# Patient Record
Sex: Female | Born: 1965
Health system: Southern US, Community
[De-identification: ages and names within clinical notes are randomized; demographics above are authoritative.]

## PROBLEM LIST (undated history)

## (undated) DIAGNOSIS — G629 Polyneuropathy, unspecified: Secondary | ICD-10-CM

## (undated) DIAGNOSIS — E119 Type 2 diabetes mellitus without complications: Secondary | ICD-10-CM

## (undated) DIAGNOSIS — I10 Essential (primary) hypertension: Secondary | ICD-10-CM

## (undated) HISTORY — DX: Essential (primary) hypertension: I10

## (undated) HISTORY — DX: Type 2 diabetes mellitus without complications: E11.9

## (undated) HISTORY — DX: Polyneuropathy, unspecified: G62.9

---

## 1998-01-04 ENCOUNTER — Emergency Department (HOSPITAL_COMMUNITY): Admission: EM | Admit: 1998-01-04 | Discharge: 1998-01-04 | Payer: Self-pay | Admitting: Emergency Medicine

## 1998-01-04 ENCOUNTER — Encounter: Payer: Self-pay | Admitting: Internal Medicine

## 1998-01-05 ENCOUNTER — Emergency Department (HOSPITAL_COMMUNITY): Admission: EM | Admit: 1998-01-05 | Discharge: 1998-01-05 | Payer: Self-pay | Admitting: Emergency Medicine

## 1998-01-13 ENCOUNTER — Other Ambulatory Visit: Admission: RE | Admit: 1998-01-13 | Discharge: 1998-01-13 | Payer: Self-pay | Admitting: Obstetrics and Gynecology

## 1999-01-13 ENCOUNTER — Encounter: Payer: Self-pay | Admitting: Obstetrics and Gynecology

## 1999-01-13 ENCOUNTER — Inpatient Hospital Stay (HOSPITAL_COMMUNITY): Admission: AD | Admit: 1999-01-13 | Discharge: 1999-01-13 | Payer: Self-pay | Admitting: Obstetrics and Gynecology

## 1999-01-16 ENCOUNTER — Inpatient Hospital Stay (HOSPITAL_COMMUNITY): Admission: AD | Admit: 1999-01-16 | Discharge: 1999-01-16 | Payer: Self-pay | Admitting: Obstetrics and Gynecology

## 1999-01-21 ENCOUNTER — Inpatient Hospital Stay (HOSPITAL_COMMUNITY): Admission: AD | Admit: 1999-01-21 | Discharge: 1999-01-21 | Payer: Self-pay | Admitting: Obstetrics and Gynecology

## 1999-01-27 ENCOUNTER — Encounter: Admission: RE | Admit: 1999-01-27 | Discharge: 1999-02-27 | Payer: Self-pay | Admitting: Obstetrics and Gynecology

## 1999-01-29 ENCOUNTER — Inpatient Hospital Stay (HOSPITAL_COMMUNITY): Admission: AD | Admit: 1999-01-29 | Discharge: 1999-01-29 | Payer: Self-pay | Admitting: Obstetrics and Gynecology

## 1999-01-29 ENCOUNTER — Encounter: Payer: Self-pay | Admitting: Obstetrics and Gynecology

## 1999-01-31 ENCOUNTER — Inpatient Hospital Stay (HOSPITAL_COMMUNITY): Admission: AD | Admit: 1999-01-31 | Discharge: 1999-01-31 | Payer: Self-pay | Admitting: *Deleted

## 1999-02-01 ENCOUNTER — Inpatient Hospital Stay (HOSPITAL_COMMUNITY): Admission: AD | Admit: 1999-02-01 | Discharge: 1999-02-01 | Payer: Self-pay | Admitting: Obstetrics and Gynecology

## 1999-02-04 ENCOUNTER — Inpatient Hospital Stay (HOSPITAL_COMMUNITY): Admission: AD | Admit: 1999-02-04 | Discharge: 1999-02-04 | Payer: Self-pay | Admitting: Obstetrics and Gynecology

## 1999-02-06 ENCOUNTER — Inpatient Hospital Stay (HOSPITAL_COMMUNITY): Admission: AD | Admit: 1999-02-06 | Discharge: 1999-02-06 | Payer: Self-pay | Admitting: Obstetrics and Gynecology

## 1999-02-09 ENCOUNTER — Inpatient Hospital Stay (HOSPITAL_COMMUNITY): Admission: RE | Admit: 1999-02-09 | Discharge: 1999-02-09 | Payer: Self-pay | Admitting: Obstetrics and Gynecology

## 1999-02-09 ENCOUNTER — Encounter: Payer: Self-pay | Admitting: Obstetrics and Gynecology

## 1999-02-14 ENCOUNTER — Inpatient Hospital Stay (HOSPITAL_COMMUNITY): Admission: AD | Admit: 1999-02-14 | Discharge: 1999-02-14 | Payer: Self-pay | Admitting: Obstetrics and Gynecology

## 1999-02-17 ENCOUNTER — Inpatient Hospital Stay (HOSPITAL_COMMUNITY): Admission: RE | Admit: 1999-02-17 | Discharge: 1999-02-17 | Payer: Self-pay | Admitting: Obstetrics and Gynecology

## 1999-02-17 ENCOUNTER — Encounter: Payer: Self-pay | Admitting: Obstetrics and Gynecology

## 1999-02-19 ENCOUNTER — Inpatient Hospital Stay (HOSPITAL_COMMUNITY): Admission: AD | Admit: 1999-02-19 | Discharge: 1999-02-23 | Payer: Self-pay | Admitting: Obstetrics and Gynecology

## 1999-02-24 ENCOUNTER — Inpatient Hospital Stay (HOSPITAL_COMMUNITY): Admission: AD | Admit: 1999-02-24 | Discharge: 1999-02-24 | Payer: Self-pay | Admitting: Obstetrics & Gynecology

## 1999-03-29 ENCOUNTER — Other Ambulatory Visit: Admission: RE | Admit: 1999-03-29 | Discharge: 1999-03-29 | Payer: Self-pay | Admitting: Obstetrics and Gynecology

## 1999-04-03 ENCOUNTER — Encounter: Payer: Self-pay | Admitting: Obstetrics and Gynecology

## 1999-04-03 ENCOUNTER — Ambulatory Visit (HOSPITAL_COMMUNITY): Admission: RE | Admit: 1999-04-03 | Discharge: 1999-04-03 | Payer: Self-pay | Admitting: Obstetrics and Gynecology

## 1999-12-07 ENCOUNTER — Emergency Department (HOSPITAL_COMMUNITY): Admission: EM | Admit: 1999-12-07 | Discharge: 1999-12-07 | Payer: Self-pay | Admitting: Emergency Medicine

## 2000-05-02 ENCOUNTER — Other Ambulatory Visit: Admission: RE | Admit: 2000-05-02 | Discharge: 2000-05-02 | Payer: Self-pay | Admitting: Obstetrics and Gynecology

## 2000-05-02 ENCOUNTER — Encounter (INDEPENDENT_AMBULATORY_CARE_PROVIDER_SITE_OTHER): Payer: Self-pay | Admitting: Specialist

## 2001-02-18 ENCOUNTER — Encounter: Admission: RE | Admit: 2001-02-18 | Discharge: 2001-02-18 | Payer: Self-pay | Admitting: *Deleted

## 2001-02-18 ENCOUNTER — Encounter: Payer: Self-pay | Admitting: *Deleted

## 2001-09-29 ENCOUNTER — Other Ambulatory Visit: Admission: RE | Admit: 2001-09-29 | Discharge: 2001-09-29 | Payer: Self-pay | Admitting: Obstetrics and Gynecology

## 2002-12-23 ENCOUNTER — Encounter: Admission: RE | Admit: 2002-12-23 | Discharge: 2002-12-23 | Payer: Self-pay | Admitting: Internal Medicine

## 2002-12-24 ENCOUNTER — Encounter: Admission: RE | Admit: 2002-12-24 | Discharge: 2002-12-24 | Payer: Self-pay | Admitting: Geriatric Medicine

## 2003-11-05 ENCOUNTER — Other Ambulatory Visit: Admission: RE | Admit: 2003-11-05 | Discharge: 2003-11-05 | Payer: Self-pay | Admitting: Obstetrics and Gynecology

## 2004-10-30 ENCOUNTER — Encounter: Admission: RE | Admit: 2004-10-30 | Discharge: 2004-10-30 | Payer: Self-pay | Admitting: Internal Medicine

## 2005-02-16 ENCOUNTER — Encounter: Admission: RE | Admit: 2005-02-16 | Discharge: 2005-02-16 | Payer: Self-pay | Admitting: Geriatric Medicine

## 2005-04-12 ENCOUNTER — Other Ambulatory Visit: Admission: RE | Admit: 2005-04-12 | Discharge: 2005-04-12 | Payer: Self-pay | Admitting: Obstetrics and Gynecology

## 2005-06-15 ENCOUNTER — Encounter: Admission: RE | Admit: 2005-06-15 | Discharge: 2005-06-15 | Payer: Self-pay | Admitting: Geriatric Medicine

## 2006-01-18 ENCOUNTER — Encounter: Admission: RE | Admit: 2006-01-18 | Discharge: 2006-01-18 | Payer: Self-pay | Admitting: Obstetrics and Gynecology

## 2007-02-07 ENCOUNTER — Encounter: Admission: RE | Admit: 2007-02-07 | Discharge: 2007-02-07 | Payer: Self-pay | Admitting: Geriatric Medicine

## 2007-06-13 ENCOUNTER — Encounter: Admission: RE | Admit: 2007-06-13 | Discharge: 2007-06-13 | Payer: Self-pay | Admitting: Obstetrics and Gynecology

## 2007-06-18 ENCOUNTER — Encounter: Admission: RE | Admit: 2007-06-18 | Discharge: 2007-06-18 | Payer: Self-pay | Admitting: Obstetrics and Gynecology

## 2007-11-28 ENCOUNTER — Other Ambulatory Visit: Admission: RE | Admit: 2007-11-28 | Discharge: 2007-11-28 | Payer: Self-pay | Admitting: Obstetrics and Gynecology

## 2008-07-09 ENCOUNTER — Encounter: Admission: RE | Admit: 2008-07-09 | Discharge: 2008-07-09 | Payer: Self-pay | Admitting: Obstetrics and Gynecology

## 2008-08-02 ENCOUNTER — Encounter: Admission: RE | Admit: 2008-08-02 | Discharge: 2008-08-02 | Payer: Self-pay | Admitting: Geriatric Medicine

## 2009-04-19 ENCOUNTER — Other Ambulatory Visit: Admission: RE | Admit: 2009-04-19 | Discharge: 2009-04-19 | Payer: Self-pay | Admitting: Obstetrics and Gynecology

## 2009-09-14 ENCOUNTER — Encounter: Admission: RE | Admit: 2009-09-14 | Discharge: 2009-09-14 | Payer: Self-pay | Admitting: Geriatric Medicine

## 2010-02-26 ENCOUNTER — Encounter: Payer: Self-pay | Admitting: Geriatric Medicine

## 2010-02-26 ENCOUNTER — Encounter: Payer: Self-pay | Admitting: Obstetrics and Gynecology

## 2010-10-11 ENCOUNTER — Other Ambulatory Visit: Payer: Self-pay | Admitting: Geriatric Medicine

## 2010-10-11 DIAGNOSIS — Z1231 Encounter for screening mammogram for malignant neoplasm of breast: Secondary | ICD-10-CM

## 2010-10-20 ENCOUNTER — Ambulatory Visit: Payer: Self-pay

## 2010-10-23 ENCOUNTER — Ambulatory Visit: Payer: Self-pay

## 2010-10-30 ENCOUNTER — Ambulatory Visit
Admission: RE | Admit: 2010-10-30 | Discharge: 2010-10-30 | Disposition: A | Discharge status: Home / Self Care | Payer: PRIVATE HEALTH INSURANCE | Source: Ambulatory Visit | Attending: Geriatric Medicine | Admitting: Geriatric Medicine

## 2010-10-30 DIAGNOSIS — Z1231 Encounter for screening mammogram for malignant neoplasm of breast: Secondary | ICD-10-CM

## 2010-11-23 IMAGING — MG MM SCREEN MAMMOGRAM BILATERAL
4 series · 4 of 4 positions shown · non-contrast
Comparison: none

DG SCREEN MAMMOGRAM BILATERAL
Bilateral CC and MLO view(s) were taken.

DIGITAL SCREENING MAMMOGRAM WITH CAD:
There are scattered fibroglandular densities.  No masses or malignant type calcifications are 
identified.  Compared with prior studies.

[R CC]
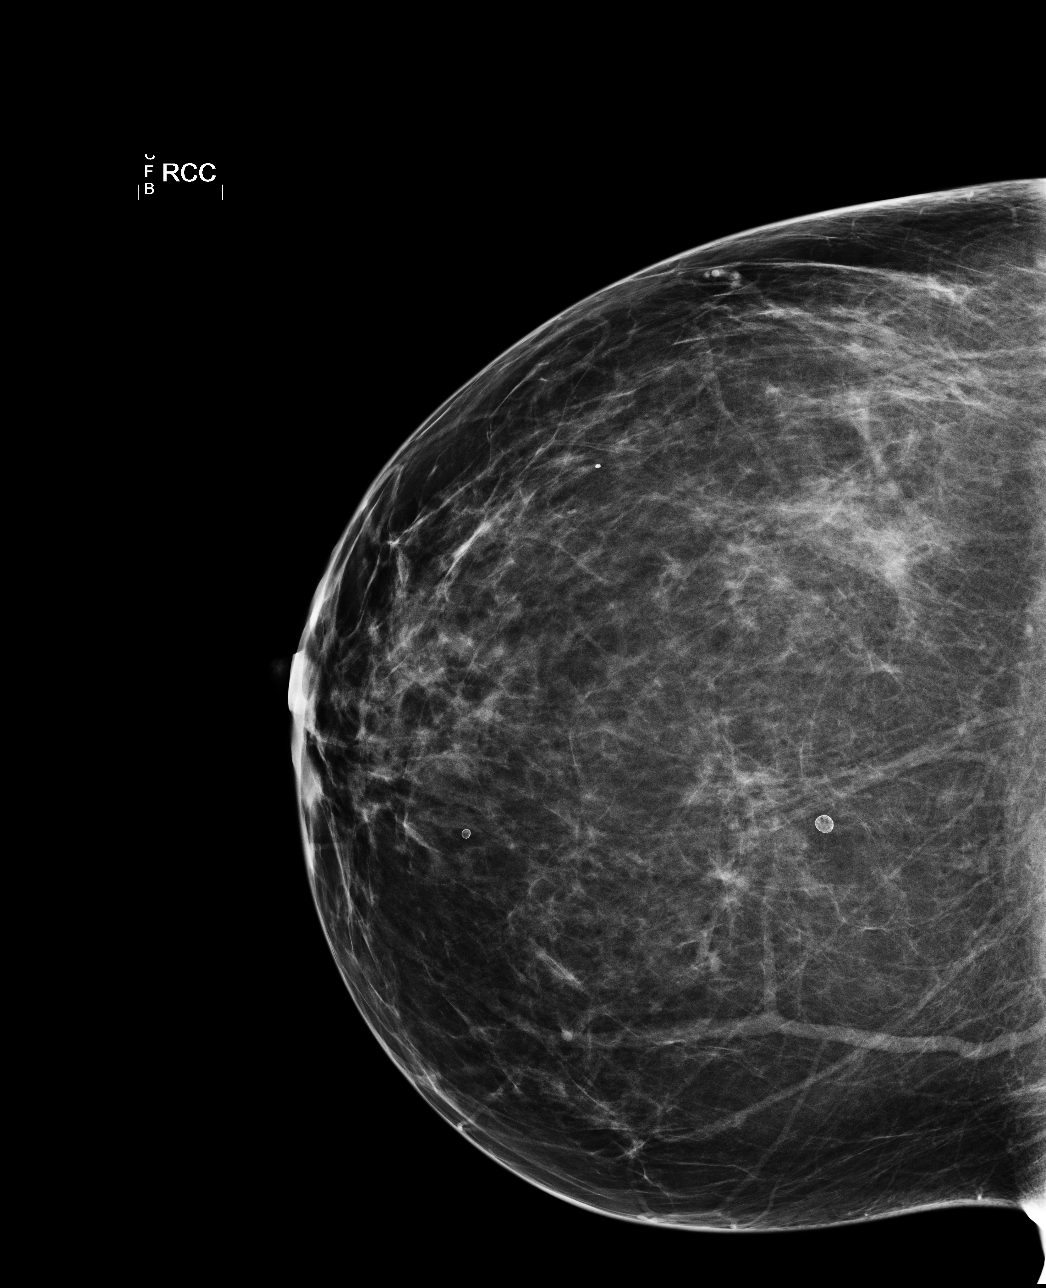

[L CC]
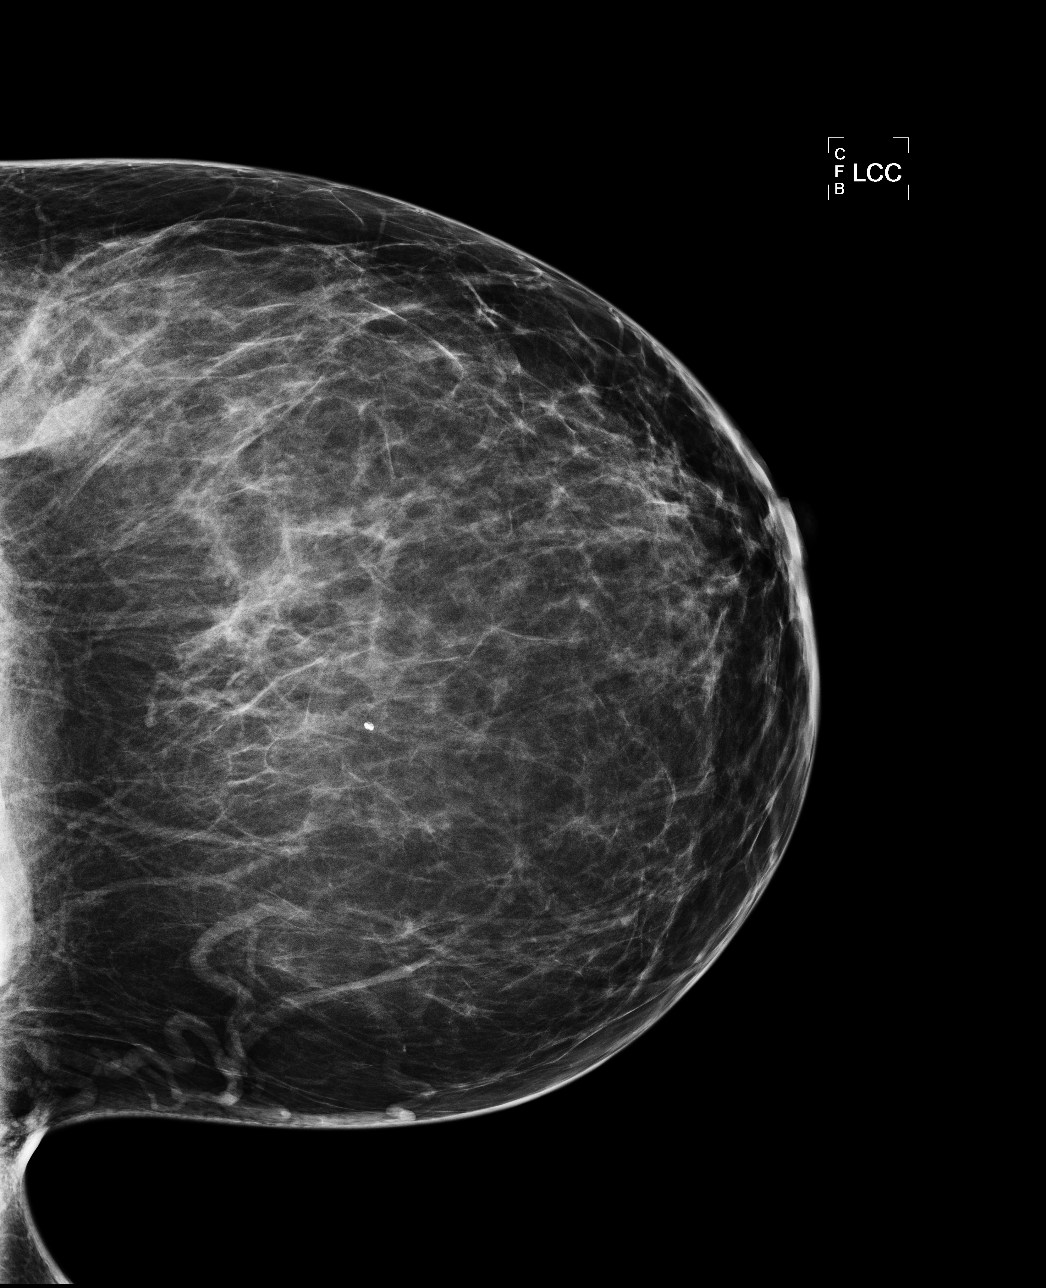

[L MLO]
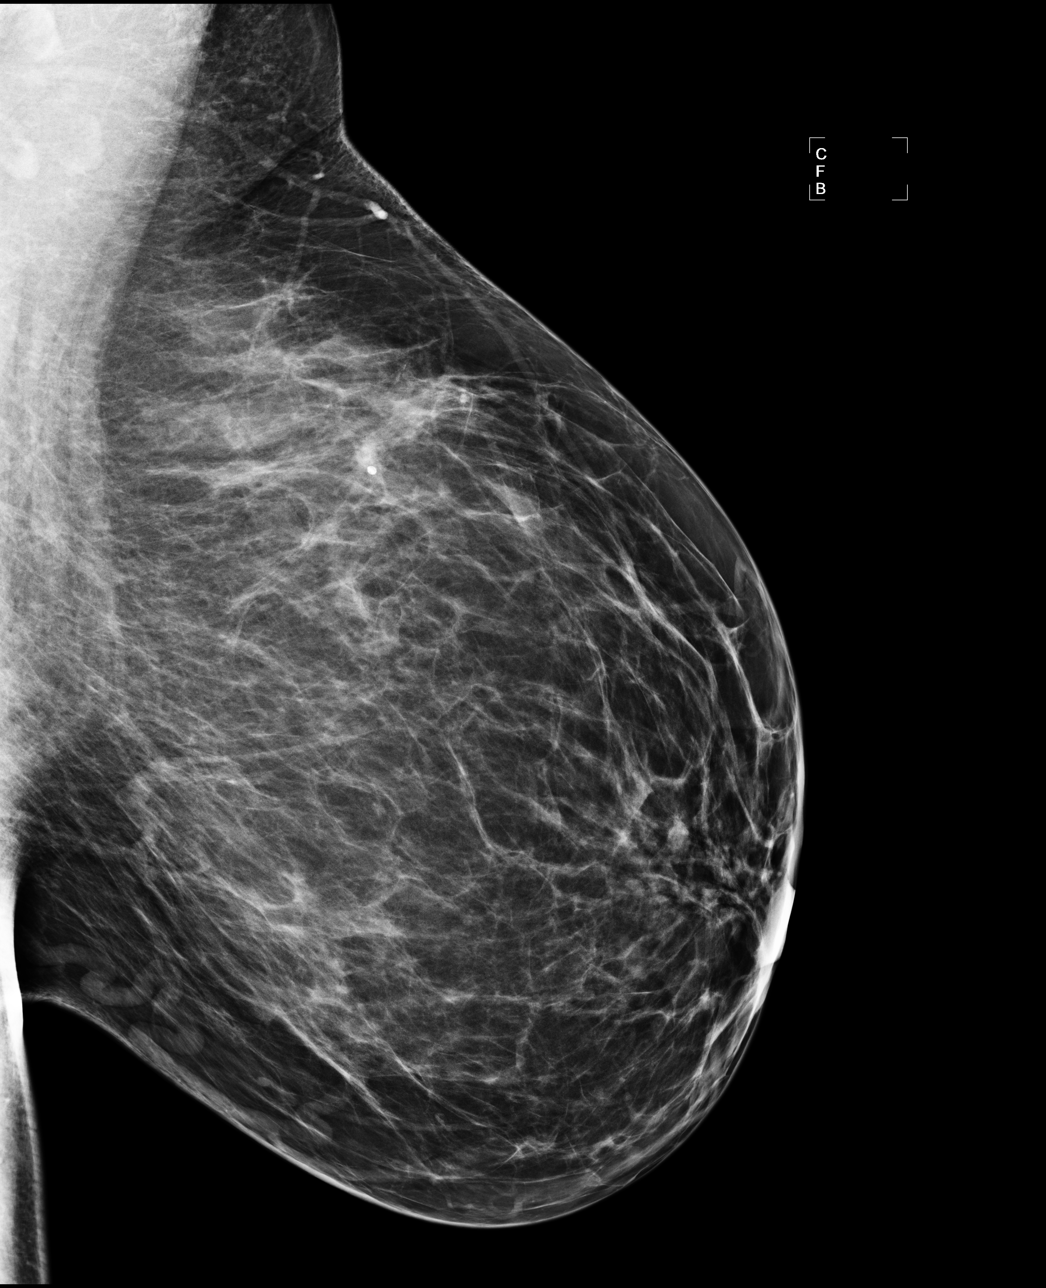

[R MLO]
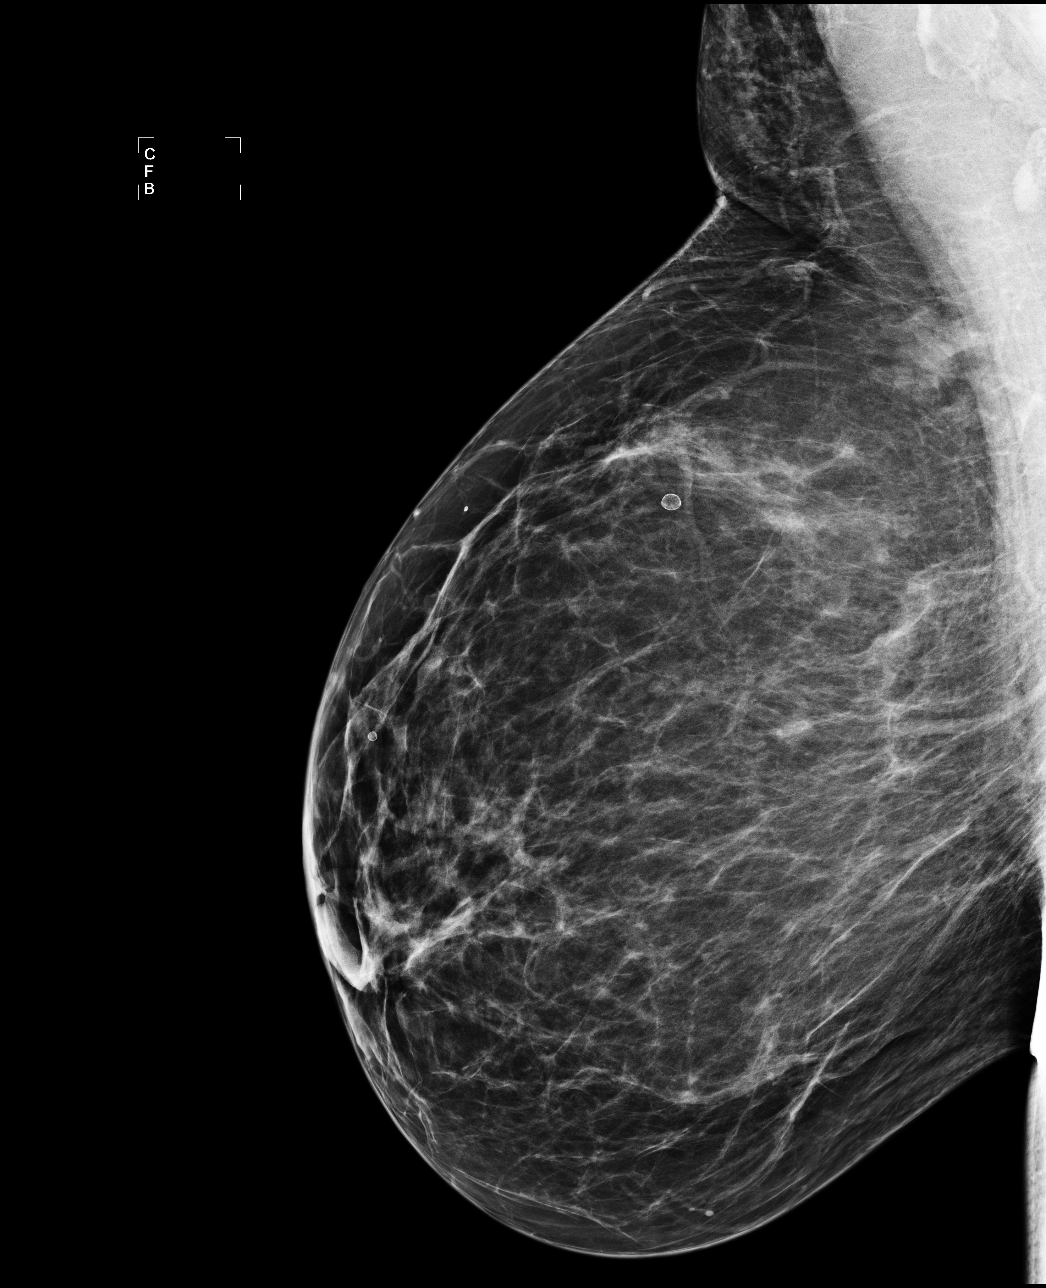

[4 of 4 positions shown; findings below may reference images not displayed]

IMPRESSION: No specific mammographic evidence of malignancy.  Next screening mammogram is recommended in one 
year.

A result letter of this screening mammogram will be mailed directly to the patient.

ASSESSMENT: Negative - BI-RADS 1

Screening mammogram in 1 year.
ANALYZED BY COMPUTER AIDED DETECTION. , THIS PROCEDURE WAS A DIGITAL MAMMOGRAM.

## 2011-09-06 ENCOUNTER — Other Ambulatory Visit: Payer: Self-pay | Admitting: *Deleted

## 2011-09-06 DIAGNOSIS — M542 Cervicalgia: Secondary | ICD-10-CM

## 2011-09-09 ENCOUNTER — Inpatient Hospital Stay: Admission: RE | Admit: 2011-09-09 | Payer: PRIVATE HEALTH INSURANCE | Source: Ambulatory Visit

## 2011-11-29 ENCOUNTER — Other Ambulatory Visit: Payer: Self-pay | Admitting: Obstetrics

## 2011-11-29 DIAGNOSIS — Z1231 Encounter for screening mammogram for malignant neoplasm of breast: Secondary | ICD-10-CM

## 2011-12-10 ENCOUNTER — Ambulatory Visit
Admission: RE | Admit: 2011-12-10 | Discharge: 2011-12-10 | Disposition: A | Discharge status: Home / Self Care | Payer: PRIVATE HEALTH INSURANCE | Source: Ambulatory Visit | Attending: Obstetrics | Admitting: Obstetrics

## 2011-12-10 DIAGNOSIS — Z1231 Encounter for screening mammogram for malignant neoplasm of breast: Secondary | ICD-10-CM

## 2012-06-04 ENCOUNTER — Other Ambulatory Visit: Payer: Self-pay | Admitting: Orthopedic Surgery

## 2012-06-04 DIAGNOSIS — R531 Weakness: Secondary | ICD-10-CM

## 2012-06-04 DIAGNOSIS — R52 Pain, unspecified: Secondary | ICD-10-CM

## 2012-06-09 ENCOUNTER — Ambulatory Visit
Admission: RE | Admit: 2012-06-09 | Discharge: 2012-06-09 | Disposition: A | Discharge status: Home / Self Care | Payer: PRIVATE HEALTH INSURANCE | Source: Ambulatory Visit | Attending: Orthopedic Surgery | Admitting: Orthopedic Surgery

## 2012-06-09 DIAGNOSIS — R52 Pain, unspecified: Secondary | ICD-10-CM

## 2012-06-09 DIAGNOSIS — R531 Weakness: Secondary | ICD-10-CM

## 2013-04-21 ENCOUNTER — Other Ambulatory Visit: Payer: Self-pay

## 2013-04-21 DIAGNOSIS — Z1231 Encounter for screening mammogram for malignant neoplasm of breast: Secondary | ICD-10-CM

## 2013-05-11 ENCOUNTER — Ambulatory Visit: Payer: PRIVATE HEALTH INSURANCE

## 2013-06-01 ENCOUNTER — Ambulatory Visit
Admission: RE | Admit: 2013-06-01 | Discharge: 2013-06-01 | Disposition: A | Discharge status: Home / Self Care | Payer: PRIVATE HEALTH INSURANCE | Source: Ambulatory Visit

## 2013-06-01 ENCOUNTER — Encounter (INDEPENDENT_AMBULATORY_CARE_PROVIDER_SITE_OTHER): Payer: Self-pay

## 2013-06-01 DIAGNOSIS — Z1231 Encounter for screening mammogram for malignant neoplasm of breast: Secondary | ICD-10-CM

## 2013-12-10 ENCOUNTER — Ambulatory Visit (INDEPENDENT_AMBULATORY_CARE_PROVIDER_SITE_OTHER): Payer: PRIVATE HEALTH INSURANCE | Admitting: Podiatry

## 2013-12-10 ENCOUNTER — Encounter: Payer: Self-pay | Admitting: Podiatry

## 2013-12-10 ENCOUNTER — Ambulatory Visit (INDEPENDENT_AMBULATORY_CARE_PROVIDER_SITE_OTHER): Payer: PRIVATE HEALTH INSURANCE

## 2013-12-10 VITALS — BP 127/82 | HR 98 | Resp 16 | Ht 62.0 in | Wt 172.0 lb

## 2013-12-10 DIAGNOSIS — E1142 Type 2 diabetes mellitus with diabetic polyneuropathy: Secondary | ICD-10-CM

## 2013-12-10 DIAGNOSIS — M722 Plantar fascial fibromatosis: Secondary | ICD-10-CM

## 2013-12-10 NOTE — Progress Notes (Signed)
   Subjective:    Patient ID: Deborah Bush, female    DOB: Jun 18, 1965, 48 y.o.   MRN: [id number]  HPI Comments: Right heel pain , but both feet just hurt and that is from the diabetes. i have neuropathy and my blood sugar runs high , last time it was checked was 214   Foot Pain      Review of Systems  Endocrine:       Diabetes   All other systems reviewed and are negative.      Objective:   Physical Exam: I have reviewed her past medical history medications allergies surgery social history and review of systems. Pulses are strongly palpable bilateral. Neurologic sensorium is intact per Semmes-Weinstein monofilament. Deep tendon reflexes are intact bilateral muscle strength is 5 over 5 dorsiflexion plantar flexors and inverters and everters all intrinsic musculature is intact. Orthopedic evaluation demonstrates all joints distal to the ankle of a full range of motion without crepitation. She has pain on palpation medial calcaneal tubercle of the right heel minimally so on the left heel. Radiographic evaluation of the bilateral foot does not demonstrate any type of osseus abnormalities other than a small plantar distally oriented calcaneal heel spur. Soft tissue density at the plantar fascial calcaneal insertion site of the right heel is noted. Cutaneous evaluation demonstrates supple well-hydrated cutis no erythema edema cellulitis drainage or odor.        Assessment & Plan:  Assessment: Plantar fasciitis right. Diabetes mellitus.  Plan: Discussed etiology pathology conservative versus surgical therapies. Injected Kenalog and local anesthetic to the right heel today at the point of maximal tenderness. Placed her in a plantar fascial brace as well as a night splint. Discussed appropriate shoe gear stretching exercises ice therapy and shoe gear modifications. We discussed the etiology pathology conservative versus surgical therapies. I will follow-up with her in 1 month. No oral  medication was prescribed.

## 2014-01-07 ENCOUNTER — Ambulatory Visit: Payer: PRIVATE HEALTH INSURANCE | Admitting: Podiatry

## 2014-06-29 ENCOUNTER — Other Ambulatory Visit: Payer: Self-pay

## 2014-06-29 DIAGNOSIS — Z1231 Encounter for screening mammogram for malignant neoplasm of breast: Secondary | ICD-10-CM

## 2014-07-12 ENCOUNTER — Ambulatory Visit
Admission: RE | Admit: 2014-07-12 | Discharge: 2014-07-12 | Disposition: A | Discharge status: Home / Self Care | Payer: PRIVATE HEALTH INSURANCE | Source: Ambulatory Visit

## 2014-07-12 DIAGNOSIS — Z1231 Encounter for screening mammogram for malignant neoplasm of breast: Secondary | ICD-10-CM

## 2015-07-20 ENCOUNTER — Other Ambulatory Visit: Payer: Self-pay | Admitting: Obstetrics

## 2015-07-20 DIAGNOSIS — Z1231 Encounter for screening mammogram for malignant neoplasm of breast: Secondary | ICD-10-CM

## 2015-08-08 ENCOUNTER — Ambulatory Visit: Payer: PRIVATE HEALTH INSURANCE

## 2015-09-22 ENCOUNTER — Telehealth: Payer: Self-pay | Admitting: Cardiovascular Disease

## 2015-09-22 NOTE — Telephone Encounter (Signed)
Received records from [facility] for appointment on 11/02/15 with Dr Royann Shiversroitoru.  [facility] Dr Croitoru's schedule on 11/02/15. lp

## 2015-10-24 ENCOUNTER — Ambulatory Visit
Admission: RE | Admit: 2015-10-24 | Discharge: 2015-10-24 | Disposition: A | Discharge status: Home / Self Care | Payer: PRIVATE HEALTH INSURANCE | Source: Ambulatory Visit | Attending: Obstetrics | Admitting: Obstetrics

## 2015-10-24 DIAGNOSIS — Z1231 Encounter for screening mammogram for malignant neoplasm of breast: Secondary | ICD-10-CM

## 2015-11-02 ENCOUNTER — Ambulatory Visit: Payer: PRIVATE HEALTH INSURANCE | Admitting: Cardiovascular Disease

## 2016-12-21 ENCOUNTER — Other Ambulatory Visit: Payer: Self-pay | Admitting: Obstetrics

## 2016-12-21 DIAGNOSIS — Z1231 Encounter for screening mammogram for malignant neoplasm of breast: Secondary | ICD-10-CM

## 2017-01-03 ENCOUNTER — Ambulatory Visit
Admission: RE | Admit: 2017-01-03 | Discharge: 2017-01-03 | Disposition: A | Discharge status: Home / Self Care | Payer: PRIVATE HEALTH INSURANCE | Source: Ambulatory Visit | Attending: Obstetrics | Admitting: Obstetrics

## 2017-01-03 DIAGNOSIS — Z1231 Encounter for screening mammogram for malignant neoplasm of breast: Secondary | ICD-10-CM

## 2018-01-14 ENCOUNTER — Other Ambulatory Visit: Payer: Self-pay | Admitting: Obstetrics

## 2018-01-14 DIAGNOSIS — Z1231 Encounter for screening mammogram for malignant neoplasm of breast: Secondary | ICD-10-CM

## 2018-02-24 ENCOUNTER — Ambulatory Visit: Payer: PRIVATE HEALTH INSURANCE

## 2018-03-31 DIAGNOSIS — Z1151 Encounter for screening for human papillomavirus (HPV): Secondary | ICD-10-CM | POA: Diagnosis not present

## 2018-03-31 DIAGNOSIS — Z6830 Body mass index (BMI) 30.0-30.9, adult: Secondary | ICD-10-CM | POA: Diagnosis not present

## 2018-03-31 DIAGNOSIS — Z01419 Encounter for gynecological examination (general) (routine) without abnormal findings: Secondary | ICD-10-CM | POA: Diagnosis not present

## 2018-04-07 ENCOUNTER — Ambulatory Visit: Payer: PRIVATE HEALTH INSURANCE

## 2018-05-01 DIAGNOSIS — J309 Allergic rhinitis, unspecified: Secondary | ICD-10-CM | POA: Diagnosis not present

## 2018-05-01 DIAGNOSIS — J9801 Acute bronchospasm: Secondary | ICD-10-CM | POA: Diagnosis not present

## 2018-05-12 ENCOUNTER — Ambulatory Visit: Payer: PRIVATE HEALTH INSURANCE

## 2018-06-23 ENCOUNTER — Ambulatory Visit: Payer: PRIVATE HEALTH INSURANCE

## 2018-09-29 ENCOUNTER — Other Ambulatory Visit: Payer: Self-pay | Admitting: Obstetrics

## 2018-09-29 DIAGNOSIS — Z1231 Encounter for screening mammogram for malignant neoplasm of breast: Secondary | ICD-10-CM

## 2018-11-11 ENCOUNTER — Ambulatory Visit: Payer: PRIVATE HEALTH INSURANCE

## 2018-11-21 ENCOUNTER — Ambulatory Visit
Admission: RE | Admit: 2018-11-21 | Discharge: 2018-11-21 | Disposition: A | Discharge status: Home / Self Care | Payer: PRIVATE HEALTH INSURANCE | Source: Ambulatory Visit | Attending: Obstetrics | Admitting: Obstetrics

## 2018-11-21 ENCOUNTER — Other Ambulatory Visit: Payer: Self-pay

## 2018-11-21 DIAGNOSIS — Z1231 Encounter for screening mammogram for malignant neoplasm of breast: Secondary | ICD-10-CM | POA: Diagnosis not present

## 2019-01-23 DIAGNOSIS — E7800 Pure hypercholesterolemia, unspecified: Secondary | ICD-10-CM | POA: Diagnosis not present

## 2019-01-23 DIAGNOSIS — I10 Essential (primary) hypertension: Secondary | ICD-10-CM | POA: Diagnosis not present

## 2019-01-23 DIAGNOSIS — F4321 Adjustment disorder with depressed mood: Secondary | ICD-10-CM | POA: Diagnosis not present

## 2019-01-23 DIAGNOSIS — E1142 Type 2 diabetes mellitus with diabetic polyneuropathy: Secondary | ICD-10-CM | POA: Diagnosis not present

## 2019-03-17 ENCOUNTER — Other Ambulatory Visit: Payer: Self-pay | Admitting: Gastroenterology

## 2019-03-17 DIAGNOSIS — Z1211 Encounter for screening for malignant neoplasm of colon: Secondary | ICD-10-CM | POA: Diagnosis not present

## 2019-03-17 DIAGNOSIS — R140 Abdominal distension (gaseous): Secondary | ICD-10-CM

## 2019-03-17 DIAGNOSIS — R143 Flatulence: Secondary | ICD-10-CM

## 2019-03-31 ENCOUNTER — Encounter (HOSPITAL_COMMUNITY): Payer: Self-pay

## 2019-03-31 ENCOUNTER — Encounter (HOSPITAL_COMMUNITY): Payer: BC Managed Care – PPO | Attending: Gastroenterology

## 2019-04-01 DIAGNOSIS — Z1159 Encounter for screening for other viral diseases: Secondary | ICD-10-CM | POA: Diagnosis not present

## 2019-04-06 DIAGNOSIS — Z1211 Encounter for screening for malignant neoplasm of colon: Secondary | ICD-10-CM | POA: Diagnosis not present

## 2019-04-06 DIAGNOSIS — K5730 Diverticulosis of large intestine without perforation or abscess without bleeding: Secondary | ICD-10-CM | POA: Diagnosis not present

## 2019-04-06 DIAGNOSIS — D125 Benign neoplasm of sigmoid colon: Secondary | ICD-10-CM | POA: Diagnosis not present

## 2019-04-06 DIAGNOSIS — K635 Polyp of colon: Secondary | ICD-10-CM | POA: Diagnosis not present

## 2019-04-21 ENCOUNTER — Ambulatory Visit (HOSPITAL_COMMUNITY): Payer: BC Managed Care – PPO

## 2019-05-15 ENCOUNTER — Encounter (HOSPITAL_COMMUNITY): Payer: BC Managed Care – PPO

## 2019-05-15 ENCOUNTER — Encounter (HOSPITAL_COMMUNITY): Payer: Self-pay

## 2019-09-21 DIAGNOSIS — Z0000 Encounter for general adult medical examination without abnormal findings: Secondary | ICD-10-CM | POA: Diagnosis not present

## 2019-09-21 DIAGNOSIS — E7800 Pure hypercholesterolemia, unspecified: Secondary | ICD-10-CM | POA: Diagnosis not present

## 2019-09-21 DIAGNOSIS — J309 Allergic rhinitis, unspecified: Secondary | ICD-10-CM | POA: Diagnosis not present

## 2019-09-21 DIAGNOSIS — I10 Essential (primary) hypertension: Secondary | ICD-10-CM | POA: Diagnosis not present

## 2019-09-21 DIAGNOSIS — E1169 Type 2 diabetes mellitus with other specified complication: Secondary | ICD-10-CM | POA: Diagnosis not present

## 2019-10-14 ENCOUNTER — Ambulatory Visit (INDEPENDENT_AMBULATORY_CARE_PROVIDER_SITE_OTHER): Payer: BC Managed Care – PPO | Admitting: Orthopedic Surgery

## 2019-10-14 ENCOUNTER — Encounter: Payer: Self-pay | Admitting: Orthopedic Surgery

## 2019-10-14 ENCOUNTER — Other Ambulatory Visit: Payer: Self-pay

## 2019-10-14 VITALS — BP 165/80 | HR 72

## 2019-10-14 DIAGNOSIS — M7711 Lateral epicondylitis, right elbow: Secondary | ICD-10-CM

## 2019-10-14 NOTE — Patient Instructions (Addendum)
Ice 3 x a day otc nsaid ibuprofen or aleve Brace  Exercises  Tennis Elbow Tennis elbow is swelling (inflammation) in your outer forearm, near your elbow. Swelling affects the tissues that connect muscle to bone (tendons). Tennis elbow can happen in any sport or job in which you use your elbow too much. It is caused by doing the same motion over and over. Tennis elbow can cause:  Pain and tenderness in your forearm and the outer part of your elbow. You may have pain all the time, or only when using the arm.  A burning feeling. This runs from your elbow through your arm.  Weak grip in your hand. Follow these instructions at home: Activity  Rest your elbow and wrist. Avoid activities that cause problems, as told by your doctor.  If told by your doctor, wear an elbow strap to reduce stress on the area.  Do physical therapy exercises as told.  If you lift an object, lift it with your palm facing up. This is easier on your elbow. Lifestyle  If your tennis elbow is caused by sports, check your equipment and make sure that: ? You are using it correctly. ? It fits you well.  If your tennis elbow is caused by work or by using a computer, take breaks often to stretch your arm. Talk with your manager about how you can manage your condition at work. If you have a brace:  Wear the brace as told by your doctor. Remove it only as told by your doctor.  Loosen the brace if your fingers tingle, get numb, or turn cold and blue.  Keep the brace clean.  If the brace is not waterproof, ask your doctor if you may take the brace off for bathing. If you must keep the brace on while bathing: ? Do not let it get wet. ? Cover it with a watertight covering when you take a bath or a shower. General instructions   If told, put ice on the painful area: ? Put ice in a plastic bag. ? Place a towel between your skin and the bag. ? Leave the ice on for 20 minutes, 2-3 times a day.  Take over-the-counter  and prescription medicines only as told by your doctor.  Keep all follow-up visits as told by your doctor. This is important. Contact a doctor if:  Your pain does not get better with treatment.  Your pain gets worse.  You have weakness in your forearm, hand, or fingers.  You cannot feel your forearm, hand, or fingers. Summary  Tennis elbow is swelling (inflammation) in your outer forearm, near your elbow.  Tennis elbow is caused by doing the same motion over and over.  Rest your elbow and wrist. Avoid activities that cause problems, as told by your doctor.  If told, put ice on the painful area for 20 minutes, 2-3 times a day. This information is not intended to replace advice given to you by your health care provider. Make sure you discuss any questions you have with your health care provider. Document Revised: 10/18/2017 Document Reviewed: 11/06/2016 Elsevier Patient Education  2020 [employer].

## 2019-10-14 NOTE — Progress Notes (Signed)
NEW PROBLEM//OFFICE VISIT  Chief Complaint  Patient presents with  . 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  Neurological: Negative for tingling.     Past Medical History:  Diagnosis Date  . Diabetes mellitus without complication (HCC)   . Hypertension   . Neuropathy     No past surgical history on file.  Family History  Problem Relation Age of Onset  . Breast cancer Maternal Grandmother    Social History   Tobacco Use  . Smoking status: Never Smoker  . Smokeless tobacco: Never Used  Substance Use Topics  . Alcohol use: Not Currently    Alcohol/week: 0.0 standard drinks  . Drug use: Never    Allergies  Allergen Reactions  . Ivp Dye [Iodinated Diagnostic Agents] Hives    Current Meds  Medication Sig  . linagliptin (TRADJENTA) 5 MG TABS tablet Take 5 mg by mouth daily.  Marland Kitchen losartan-hydrochlorothiazide (HYZAAR) 100-25 MG per tablet Take 1 tablet by mouth daily.  . metFORMIN (GLUCOPHAGE) 1000 MG tablet Take 1,000 mg by mouth 2 (two) times daily with a meal.  . pantoprazole (PROTONIX) 40 MG tablet Take 40 mg by mouth daily.  . pregabalin (LYRICA) 75 MG capsule Take 75 mg by mouth 3 (three) times daily.  . simvastatin (ZOCOR) 20 MG tablet SMARTSIG:1 Tablet(s) By Mouth Every Evening  . TRESIBA FLEXTOUCH 100 UNIT/ML FlexTouch Pen SMARTSIG:50 Unit(s) SUB-Q Daily  . [DISCONTINUED] pregabalin (LYRICA) 50 MG capsule Take 50 mg by mouth 3 (three) times daily.    BP (!) 165/80   Pulse 72   LMP 10/01/2010   Physical Exam Constitutional:      General: She is not in acute distress.    Appearance:  She is well-developed.  Cardiovascular:     Comments: No peripheral edema Skin:    General: Skin is warm and dry.  Neurological:     Mental Status: She is alert and oriented to person, place, and time.     Sensory: No sensory deficit.     Coordination: Coordination normal.     Gait: Gait normal.     Deep Tendon Reflexes: Reflexes are normal and symmetric.     Ortho Exam  Right elbow tenderness over the lateral epicondyle with decreased extension compared to the left side No instability Normal strength Positive provocative test wrist extension and long finger extension against resistance   MEDICAL DECISION MAKING  A.  Encounter Diagnosis  Name Primary?  . Lateral epicondylitis, right elbow Yes    B. DATA ANALYSED:   IMAGING: Interpretation of images: No images Orders: No orders  Outside records reviewed: No outside records   C. MANAGEMENT   Cryotherapy Patient education Over-the-counter NSAIDs Injection Bracing Exercises  Procedure note injection for right tennis elbow   Diagnosis right tennis elbow  Anesthesia ethyl chloride was used Alcohol use is clean the skin  After we obtained verbal consent and timeout a 25-gauge needle was used to inject 40 mg of Depo-Medrol and 3 cc of 1% lidocaine just distal to the insertion of the ECRB  There were no complications and a sterile bandage was applied.   No orders of the defined types were placed in this encounter.     Fuller Canada, MD  10/14/2019 11:36 AM

## 2019-12-14 ENCOUNTER — Ambulatory Visit: Payer: BC Managed Care – PPO | Admitting: Orthopedic Surgery

## 2019-12-14 ENCOUNTER — Encounter: Payer: Self-pay | Admitting: Orthopedic Surgery

## 2020-02-02 ENCOUNTER — Other Ambulatory Visit: Payer: Self-pay | Admitting: Obstetrics

## 2020-02-02 DIAGNOSIS — Z1231 Encounter for screening mammogram for malignant neoplasm of breast: Secondary | ICD-10-CM

## 2020-02-17 DIAGNOSIS — R0981 Nasal congestion: Secondary | ICD-10-CM | POA: Diagnosis not present

## 2020-02-17 DIAGNOSIS — R059 Cough, unspecified: Secondary | ICD-10-CM | POA: Diagnosis not present

## 2020-03-14 ENCOUNTER — Other Ambulatory Visit: Payer: Self-pay

## 2020-03-14 ENCOUNTER — Ambulatory Visit
Admission: RE | Admit: 2020-03-14 | Discharge: 2020-03-14 | Disposition: A | Discharge status: Home / Self Care | Payer: BC Managed Care – PPO | Source: Ambulatory Visit | Attending: Obstetrics | Admitting: Obstetrics

## 2020-03-14 DIAGNOSIS — Z1231 Encounter for screening mammogram for malignant neoplasm of breast: Secondary | ICD-10-CM

## 2020-03-17 DIAGNOSIS — Z6829 Body mass index (BMI) 29.0-29.9, adult: Secondary | ICD-10-CM | POA: Diagnosis not present

## 2020-03-17 DIAGNOSIS — Z01419 Encounter for gynecological examination (general) (routine) without abnormal findings: Secondary | ICD-10-CM | POA: Diagnosis not present

## 2020-09-29 DIAGNOSIS — E7800 Pure hypercholesterolemia, unspecified: Secondary | ICD-10-CM | POA: Diagnosis not present

## 2020-09-29 DIAGNOSIS — E1142 Type 2 diabetes mellitus with diabetic polyneuropathy: Secondary | ICD-10-CM | POA: Diagnosis not present

## 2020-09-29 DIAGNOSIS — I10 Essential (primary) hypertension: Secondary | ICD-10-CM | POA: Diagnosis not present

## 2020-11-03 DIAGNOSIS — N9089 Other specified noninflammatory disorders of vulva and perineum: Secondary | ICD-10-CM | POA: Diagnosis not present

## 2020-11-07 DIAGNOSIS — N751 Abscess of Bartholin's gland: Secondary | ICD-10-CM | POA: Diagnosis not present

## 2020-12-06 DIAGNOSIS — N760 Acute vaginitis: Secondary | ICD-10-CM | POA: Diagnosis not present

## 2020-12-06 DIAGNOSIS — N751 Abscess of Bartholin's gland: Secondary | ICD-10-CM | POA: Diagnosis not present

## 2020-12-06 DIAGNOSIS — N898 Other specified noninflammatory disorders of vagina: Secondary | ICD-10-CM | POA: Diagnosis not present

## 2020-12-07 DIAGNOSIS — E1142 Type 2 diabetes mellitus with diabetic polyneuropathy: Secondary | ICD-10-CM | POA: Diagnosis not present

## 2020-12-07 DIAGNOSIS — Z23 Encounter for immunization: Secondary | ICD-10-CM | POA: Diagnosis not present

## 2020-12-07 DIAGNOSIS — M542 Cervicalgia: Secondary | ICD-10-CM | POA: Diagnosis not present

## 2020-12-07 DIAGNOSIS — I10 Essential (primary) hypertension: Secondary | ICD-10-CM | POA: Diagnosis not present

## 2020-12-07 DIAGNOSIS — M791 Myalgia, unspecified site: Secondary | ICD-10-CM | POA: Diagnosis not present

## 2020-12-12 DIAGNOSIS — M9902 Segmental and somatic dysfunction of thoracic region: Secondary | ICD-10-CM | POA: Diagnosis not present

## 2020-12-12 DIAGNOSIS — M546 Pain in thoracic spine: Secondary | ICD-10-CM | POA: Diagnosis not present

## 2020-12-12 DIAGNOSIS — M9901 Segmental and somatic dysfunction of cervical region: Secondary | ICD-10-CM | POA: Diagnosis not present

## 2020-12-12 DIAGNOSIS — M542 Cervicalgia: Secondary | ICD-10-CM | POA: Diagnosis not present

## 2020-12-14 DIAGNOSIS — M9901 Segmental and somatic dysfunction of cervical region: Secondary | ICD-10-CM | POA: Diagnosis not present

## 2020-12-14 DIAGNOSIS — M9902 Segmental and somatic dysfunction of thoracic region: Secondary | ICD-10-CM | POA: Diagnosis not present

## 2020-12-14 DIAGNOSIS — M542 Cervicalgia: Secondary | ICD-10-CM | POA: Diagnosis not present

## 2020-12-14 DIAGNOSIS — M546 Pain in thoracic spine: Secondary | ICD-10-CM | POA: Diagnosis not present

## 2020-12-19 DIAGNOSIS — M546 Pain in thoracic spine: Secondary | ICD-10-CM | POA: Diagnosis not present

## 2020-12-19 DIAGNOSIS — M9901 Segmental and somatic dysfunction of cervical region: Secondary | ICD-10-CM | POA: Diagnosis not present

## 2020-12-19 DIAGNOSIS — M9902 Segmental and somatic dysfunction of thoracic region: Secondary | ICD-10-CM | POA: Diagnosis not present

## 2020-12-19 DIAGNOSIS — M542 Cervicalgia: Secondary | ICD-10-CM | POA: Diagnosis not present

## 2020-12-21 DIAGNOSIS — M9901 Segmental and somatic dysfunction of cervical region: Secondary | ICD-10-CM | POA: Diagnosis not present

## 2020-12-21 DIAGNOSIS — M546 Pain in thoracic spine: Secondary | ICD-10-CM | POA: Diagnosis not present

## 2020-12-21 DIAGNOSIS — M9902 Segmental and somatic dysfunction of thoracic region: Secondary | ICD-10-CM | POA: Diagnosis not present

## 2020-12-21 DIAGNOSIS — M542 Cervicalgia: Secondary | ICD-10-CM | POA: Diagnosis not present

## 2020-12-27 DIAGNOSIS — M546 Pain in thoracic spine: Secondary | ICD-10-CM | POA: Diagnosis not present

## 2020-12-27 DIAGNOSIS — M9901 Segmental and somatic dysfunction of cervical region: Secondary | ICD-10-CM | POA: Diagnosis not present

## 2020-12-27 DIAGNOSIS — M9902 Segmental and somatic dysfunction of thoracic region: Secondary | ICD-10-CM | POA: Diagnosis not present

## 2020-12-27 DIAGNOSIS — M542 Cervicalgia: Secondary | ICD-10-CM | POA: Diagnosis not present

## 2021-01-04 DIAGNOSIS — M542 Cervicalgia: Secondary | ICD-10-CM | POA: Diagnosis not present

## 2021-01-04 DIAGNOSIS — M9902 Segmental and somatic dysfunction of thoracic region: Secondary | ICD-10-CM | POA: Diagnosis not present

## 2021-01-04 DIAGNOSIS — M546 Pain in thoracic spine: Secondary | ICD-10-CM | POA: Diagnosis not present

## 2021-01-04 DIAGNOSIS — M9901 Segmental and somatic dysfunction of cervical region: Secondary | ICD-10-CM | POA: Diagnosis not present

## 2021-01-20 DIAGNOSIS — R82998 Other abnormal findings in urine: Secondary | ICD-10-CM | POA: Diagnosis not present

## 2021-04-03 DIAGNOSIS — E7800 Pure hypercholesterolemia, unspecified: Secondary | ICD-10-CM | POA: Diagnosis not present

## 2021-04-03 DIAGNOSIS — E1169 Type 2 diabetes mellitus with other specified complication: Secondary | ICD-10-CM | POA: Diagnosis not present

## 2021-04-03 DIAGNOSIS — Z0000 Encounter for general adult medical examination without abnormal findings: Secondary | ICD-10-CM | POA: Diagnosis not present

## 2021-04-03 DIAGNOSIS — K219 Gastro-esophageal reflux disease without esophagitis: Secondary | ICD-10-CM | POA: Diagnosis not present

## 2021-04-03 DIAGNOSIS — I10 Essential (primary) hypertension: Secondary | ICD-10-CM | POA: Diagnosis not present

## 2021-06-12 ENCOUNTER — Other Ambulatory Visit: Payer: Self-pay | Admitting: Family Medicine

## 2021-06-12 DIAGNOSIS — Z1231 Encounter for screening mammogram for malignant neoplasm of breast: Secondary | ICD-10-CM

## 2021-06-23 ENCOUNTER — Ambulatory Visit
Admission: RE | Admit: 2021-06-23 | Discharge: 2021-06-23 | Disposition: A | Discharge status: Home / Self Care | Payer: BC Managed Care – PPO | Source: Ambulatory Visit | Attending: Family Medicine | Admitting: Family Medicine

## 2021-06-23 DIAGNOSIS — Z1231 Encounter for screening mammogram for malignant neoplasm of breast: Secondary | ICD-10-CM

## 2021-07-04 DIAGNOSIS — E1169 Type 2 diabetes mellitus with other specified complication: Secondary | ICD-10-CM | POA: Diagnosis not present

## 2021-07-04 DIAGNOSIS — R319 Hematuria, unspecified: Secondary | ICD-10-CM | POA: Diagnosis not present

## 2021-08-04 DIAGNOSIS — R399 Unspecified symptoms and signs involving the genitourinary system: Secondary | ICD-10-CM | POA: Diagnosis not present

## 2021-08-04 DIAGNOSIS — E1142 Type 2 diabetes mellitus with diabetic polyneuropathy: Secondary | ICD-10-CM | POA: Diagnosis not present

## 2021-08-23 DIAGNOSIS — Z7984 Long term (current) use of oral hypoglycemic drugs: Secondary | ICD-10-CM | POA: Diagnosis not present

## 2021-08-23 DIAGNOSIS — Z794 Long term (current) use of insulin: Secondary | ICD-10-CM | POA: Diagnosis not present

## 2021-08-23 DIAGNOSIS — E113293 Type 2 diabetes mellitus with mild nonproliferative diabetic retinopathy without macular edema, bilateral: Secondary | ICD-10-CM | POA: Diagnosis not present

## 2021-10-04 DIAGNOSIS — E7800 Pure hypercholesterolemia, unspecified: Secondary | ICD-10-CM | POA: Diagnosis not present

## 2021-10-04 DIAGNOSIS — E1142 Type 2 diabetes mellitus with diabetic polyneuropathy: Secondary | ICD-10-CM | POA: Diagnosis not present

## 2021-10-04 DIAGNOSIS — Z7984 Long term (current) use of oral hypoglycemic drugs: Secondary | ICD-10-CM | POA: Diagnosis not present

## 2021-10-04 DIAGNOSIS — I10 Essential (primary) hypertension: Secondary | ICD-10-CM | POA: Diagnosis not present

## 2021-10-04 DIAGNOSIS — K219 Gastro-esophageal reflux disease without esophagitis: Secondary | ICD-10-CM | POA: Diagnosis not present

## 2021-10-13 DIAGNOSIS — M9901 Segmental and somatic dysfunction of cervical region: Secondary | ICD-10-CM | POA: Diagnosis not present

## 2021-10-13 DIAGNOSIS — M546 Pain in thoracic spine: Secondary | ICD-10-CM | POA: Diagnosis not present

## 2021-10-13 DIAGNOSIS — M9902 Segmental and somatic dysfunction of thoracic region: Secondary | ICD-10-CM | POA: Diagnosis not present

## 2021-10-13 DIAGNOSIS — M542 Cervicalgia: Secondary | ICD-10-CM | POA: Diagnosis not present

## 2022-01-02 DIAGNOSIS — Z124 Encounter for screening for malignant neoplasm of cervix: Secondary | ICD-10-CM | POA: Diagnosis not present

## 2022-01-02 DIAGNOSIS — Z6824 Body mass index (BMI) 24.0-24.9, adult: Secondary | ICD-10-CM | POA: Diagnosis not present

## 2022-01-02 DIAGNOSIS — Z01419 Encounter for gynecological examination (general) (routine) without abnormal findings: Secondary | ICD-10-CM | POA: Diagnosis not present

## 2022-04-05 DIAGNOSIS — E1169 Type 2 diabetes mellitus with other specified complication: Secondary | ICD-10-CM | POA: Diagnosis not present

## 2022-04-05 DIAGNOSIS — I8393 Asymptomatic varicose veins of bilateral lower extremities: Secondary | ICD-10-CM | POA: Diagnosis not present

## 2022-04-05 DIAGNOSIS — E7800 Pure hypercholesterolemia, unspecified: Secondary | ICD-10-CM | POA: Diagnosis not present

## 2022-04-05 DIAGNOSIS — I10 Essential (primary) hypertension: Secondary | ICD-10-CM | POA: Diagnosis not present

## 2022-04-05 DIAGNOSIS — Z0000 Encounter for general adult medical examination without abnormal findings: Secondary | ICD-10-CM | POA: Diagnosis not present

## 2022-04-05 DIAGNOSIS — K219 Gastro-esophageal reflux disease without esophagitis: Secondary | ICD-10-CM | POA: Diagnosis not present

## 2022-04-05 DIAGNOSIS — E1142 Type 2 diabetes mellitus with diabetic polyneuropathy: Secondary | ICD-10-CM | POA: Diagnosis not present

## 2022-07-05 ENCOUNTER — Other Ambulatory Visit: Payer: Self-pay | Admitting: Obstetrics

## 2022-07-05 DIAGNOSIS — Z1231 Encounter for screening mammogram for malignant neoplasm of breast: Secondary | ICD-10-CM

## 2022-07-12 ENCOUNTER — Ambulatory Visit: Payer: BC Managed Care – PPO

## 2022-07-26 ENCOUNTER — Ambulatory Visit
Admission: RE | Admit: 2022-07-26 | Discharge: 2022-07-26 | Disposition: A | Discharge status: Home / Self Care | Payer: BC Managed Care – PPO | Source: Ambulatory Visit | Attending: Obstetrics | Admitting: Obstetrics

## 2022-07-26 DIAGNOSIS — Z1231 Encounter for screening mammogram for malignant neoplasm of breast: Secondary | ICD-10-CM

## 2023-01-24 DIAGNOSIS — E1169 Type 2 diabetes mellitus with other specified complication: Secondary | ICD-10-CM | POA: Diagnosis not present

## 2023-01-24 DIAGNOSIS — K219 Gastro-esophageal reflux disease without esophagitis: Secondary | ICD-10-CM | POA: Diagnosis not present

## 2023-01-24 DIAGNOSIS — E1142 Type 2 diabetes mellitus with diabetic polyneuropathy: Secondary | ICD-10-CM | POA: Diagnosis not present

## 2023-01-24 DIAGNOSIS — E7800 Pure hypercholesterolemia, unspecified: Secondary | ICD-10-CM | POA: Diagnosis not present

## 2023-01-24 DIAGNOSIS — I10 Essential (primary) hypertension: Secondary | ICD-10-CM | POA: Diagnosis not present

## 2023-04-02 DIAGNOSIS — Z01419 Encounter for gynecological examination (general) (routine) without abnormal findings: Secondary | ICD-10-CM | POA: Diagnosis not present

## 2023-04-02 DIAGNOSIS — Z1331 Encounter for screening for depression: Secondary | ICD-10-CM | POA: Diagnosis not present

## 2023-04-15 DIAGNOSIS — E1169 Type 2 diabetes mellitus with other specified complication: Secondary | ICD-10-CM | POA: Diagnosis not present

## 2023-04-15 DIAGNOSIS — K625 Hemorrhage of anus and rectum: Secondary | ICD-10-CM | POA: Diagnosis not present

## 2023-04-15 DIAGNOSIS — I10 Essential (primary) hypertension: Secondary | ICD-10-CM | POA: Diagnosis not present

## 2023-04-15 DIAGNOSIS — K219 Gastro-esophageal reflux disease without esophagitis: Secondary | ICD-10-CM | POA: Diagnosis not present

## 2023-07-25 DIAGNOSIS — Z23 Encounter for immunization: Secondary | ICD-10-CM | POA: Diagnosis not present

## 2023-07-25 DIAGNOSIS — I10 Essential (primary) hypertension: Secondary | ICD-10-CM | POA: Diagnosis not present

## 2023-07-25 DIAGNOSIS — R399 Unspecified symptoms and signs involving the genitourinary system: Secondary | ICD-10-CM | POA: Diagnosis not present

## 2023-07-25 DIAGNOSIS — E1169 Type 2 diabetes mellitus with other specified complication: Secondary | ICD-10-CM | POA: Diagnosis not present

## 2023-07-25 DIAGNOSIS — E7800 Pure hypercholesterolemia, unspecified: Secondary | ICD-10-CM | POA: Diagnosis not present

## 2023-07-25 DIAGNOSIS — Z0000 Encounter for general adult medical examination without abnormal findings: Secondary | ICD-10-CM | POA: Diagnosis not present

## 2023-08-19 DIAGNOSIS — N390 Urinary tract infection, site not specified: Secondary | ICD-10-CM | POA: Diagnosis not present

## 2023-08-19 DIAGNOSIS — B3731 Acute candidiasis of vulva and vagina: Secondary | ICD-10-CM | POA: Diagnosis not present

## 2023-08-29 DIAGNOSIS — R300 Dysuria: Secondary | ICD-10-CM | POA: Diagnosis not present

## 2023-10-25 DIAGNOSIS — E1142 Type 2 diabetes mellitus with diabetic polyneuropathy: Secondary | ICD-10-CM | POA: Diagnosis not present

## 2023-10-25 DIAGNOSIS — R300 Dysuria: Secondary | ICD-10-CM | POA: Diagnosis not present

## 2023-11-21 ENCOUNTER — Other Ambulatory Visit: Payer: Self-pay | Admitting: Obstetrics

## 2023-11-21 DIAGNOSIS — Z1231 Encounter for screening mammogram for malignant neoplasm of breast: Secondary | ICD-10-CM

## 2023-12-13 ENCOUNTER — Ambulatory Visit
Admission: RE | Admit: 2023-12-13 | Discharge: 2023-12-13 | Disposition: A | Discharge status: Home / Self Care | Source: Ambulatory Visit | Attending: Obstetrics | Admitting: Obstetrics

## 2023-12-13 DIAGNOSIS — Z1231 Encounter for screening mammogram for malignant neoplasm of breast: Secondary | ICD-10-CM | POA: Diagnosis not present

## 2024-01-24 DIAGNOSIS — E1169 Type 2 diabetes mellitus with other specified complication: Secondary | ICD-10-CM | POA: Diagnosis not present

## 2024-01-24 DIAGNOSIS — I10 Essential (primary) hypertension: Secondary | ICD-10-CM | POA: Diagnosis not present

## 2024-01-24 DIAGNOSIS — K219 Gastro-esophageal reflux disease without esophagitis: Secondary | ICD-10-CM | POA: Diagnosis not present

## 2024-01-24 DIAGNOSIS — I8393 Asymptomatic varicose veins of bilateral lower extremities: Secondary | ICD-10-CM | POA: Diagnosis not present

## 2024-01-24 DIAGNOSIS — E7800 Pure hypercholesterolemia, unspecified: Secondary | ICD-10-CM | POA: Diagnosis not present
